# Patient Record
Sex: Male | Born: 1960 | Race: White | Hispanic: No | Marital: Married | Smoking: Current every day smoker
Health system: Southern US, Community
[De-identification: ages and names within clinical notes are randomized; demographics above are authoritative.]

---

## 2018-06-30 ENCOUNTER — Other Ambulatory Visit: Payer: Self-pay

## 2018-06-30 ENCOUNTER — Emergency Department (HOSPITAL_COMMUNITY)
Admission: EM | Admit: 2018-06-30 | Discharge: 2018-06-30 | Disposition: A | Discharge status: Home / Self Care | Payer: Worker's Compensation | Attending: Emergency Medicine | Admitting: Emergency Medicine

## 2018-06-30 ENCOUNTER — Emergency Department (HOSPITAL_COMMUNITY): Payer: Worker's Compensation

## 2018-06-30 ENCOUNTER — Encounter (HOSPITAL_COMMUNITY): Payer: Self-pay | Admitting: Emergency Medicine

## 2018-06-30 DIAGNOSIS — M25512 Pain in left shoulder: Secondary | ICD-10-CM | POA: Diagnosis not present

## 2018-06-30 DIAGNOSIS — W19XXXA Unspecified fall, initial encounter: Secondary | ICD-10-CM

## 2018-06-30 DIAGNOSIS — W1789XA Other fall from one level to another, initial encounter: Secondary | ICD-10-CM | POA: Diagnosis not present

## 2018-06-30 DIAGNOSIS — R0789 Other chest pain: Secondary | ICD-10-CM | POA: Diagnosis not present

## 2018-06-30 DIAGNOSIS — F17200 Nicotine dependence, unspecified, uncomplicated: Secondary | ICD-10-CM | POA: Diagnosis not present

## 2018-06-30 MED ORDER — OXYCODONE-ACETAMINOPHEN 5-325 MG PO TABS
2.0000 | ORAL_TABLET | Freq: Once | ORAL | Status: AC
  Administered 2018-06-30: 2 via ORAL
  Filled 2018-06-30: qty 2

## 2018-06-30 MED ORDER — KETOROLAC TROMETHAMINE 60 MG/2ML IM SOLN
60.0000 mg | Freq: Once | INTRAMUSCULAR | Status: AC
  Administered 2018-06-30: 60 mg via INTRAMUSCULAR
  Filled 2018-06-30: qty 2

## 2018-06-30 MED ORDER — MORPHINE SULFATE 30 MG PO TABS: 30.0000 mg | ORAL_TABLET | Freq: Four times a day (QID) | ORAL | 0 refills | Status: AC | PRN

## 2018-06-30 MED ORDER — HYDROMORPHONE HCL 1 MG/ML IJ SOLN
1.0000 mg | Freq: Once | INTRAMUSCULAR | Status: AC
  Administered 2018-06-30: 1 mg via INTRAMUSCULAR
  Filled 2018-06-30: qty 1

## 2018-06-30 NOTE — ED Notes (Signed)
Bed: WTR7 Expected date:  Expected time:  Means of arrival:  Comments: 

## 2018-06-30 NOTE — Discharge Instructions (Addendum)
Sometimes xrays can miss fractures. If you have persistent pain and not improving by 3-4 days please see your doctor or return here for reevaluation. otherwise use range of motion exercises, anti-inflammatories to help with symptoms. Prescription pain medicine if severe. Do not drive within 8 hours of taking prescription pain medicine.

## 2018-06-30 NOTE — ED Triage Notes (Signed)
Pt fell getting out of truck this morning at 9:40 this am, lost balance, denies LOC, pt c/o L scapular pain radiating to neck.

## 2018-06-30 NOTE — ED Provider Notes (Signed)
Emergency Department Provider Note   I have reviewed the triage vital signs and the nursing notes.   HISTORY  Chief Complaint Fall and Shoulder Pain   HPI Zachary Salas is a 57 y.o. male without significant past medical history the presents the emergency department today for shoulder pain.  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  He has right scapular pain and now started have some soreness and right-sided rib pain as well.  States he hit his head mildly but does not have any headache or neck pain at this time.  No injuries elsewhere.  No pelvic pain, leg pain, shoulder pain.  No rashes.  No syncope.  No other associated or modifying symptoms.    History reviewed. No pertinent past medical history.  There are no active problems to display for this patient.   History reviewed. No pertinent surgical history.    Allergies Asa [aspirin]  History reviewed. No pertinent family history.  Social History Social History   Tobacco Use  . Smoking status: Current Every Day Smoker  Substance Use Topics  . Alcohol use: Not on file  . Drug use: Not on file    Review of Systems  All other systems negative except as documented in the HPI. All pertinent positives and negatives as reviewed in the HPI. ____________________________________________   PHYSICAL EXAM:  VITAL SIGNS: ED Triage Vitals [06/30/18 1038]   Vitals:   06/30/18 1042  BP: (!) 153/84  Pulse: 95  Resp: 16  Temp: 98.1 F (36.7 C)  SpO2: 98%     Constitutional: Alert and oriented. Well appearing and in no acute distress. Eyes: Conjunctivae are normal. PERRL. EOMI. Head: Atraumatic. Nose: No congestion/rhinnorhea. Mouth/Throat: Mucous membranes are moist.  Oropharynx non-erythematous. Neck: No stridor.  No meningeal signs.   Cardiovascular:  Normal rate, regular rhythm. Good peripheral circulation. Grossly normal heart sounds.   Respiratory: Normal respiratory effort.  No retractions. Lungs CTAB. Gastrointestinal: Soft and nontender. No distention.  Musculoskeletal: Tenderness to palpation over his left scapula and pain with range of motion of his left shoulder as well.  Tenderness over his left axillary ribs. Neurologic:  Normal speech and language. No gross focal neurologic deficits are appreciated.  Skin:  Skin is warm, dry and intact. No rash noted.  ____________________________________________   RADIOLOGY  Dg Chest 2 View  Result Date: 06/30/2018 CLINICAL DATA:  Larey Seat from a tractor trailer today, former smoker, history COPD EXAM: CHEST - 2 VIEW COMPARISON:  None FINDINGS: Enlargement of cardiac silhouette. Mediastinal contours and pulmonary vascularity normal. Respiratory motion artifacts on lateral view. Lungs grossly clear. No definite infiltrate, pleural effusion or pneumothorax. Scattered mild endplate spur formation thoracic spine. IMPRESSION: Enlargement of cardiac silhouette. No acute abnormalities. Electronically Signed   By: Ulyses Southward M.D.   On: 06/30/2018 11:43   Dg Shoulder Left  Result Date: 06/30/2018 CLINICAL DATA:  Left shoulder pain after fall today. EXAM: LEFT SHOULDER - 2+ VIEW COMPARISON:  None. FINDINGS: There is no evidence of fracture or dislocation. There is no evidence of arthropathy or other focal bone abnormality. Soft tissues are unremarkable. IMPRESSION: Negative. Electronically Signed   By: Lupita Raider, M.D.   On: 06/30/2018 11:24    ____________________________________________   INITIAL IMPRESSION / ASSESSMENT AND PLAN / ED COURSE  Pain meds.  XR for traumatic injury. dispo pending same.   xr's negative. Pain improved. Will treat for same.   I discussed with the patient the possibility of a missed fracture. I told them that sometimes they are not seen on initial x-ray but if they had  persistent pain for 5 to 7 days they would need to follow-up with their primary doctor or an orthopedic doctor to get repeat evaluation and x-ray to evaluate for missed fracture or ligamentous injury and they stated understanding. Will employ RICE therapy with NSAIDs/tylenol in mean time.   Pertinent labs & imaging results that were available during my care of the patient were reviewed by me and considered in my medical decision making (see chart for details).  ____________________________________________  FINAL CLINICAL IMPRESSION(S) / ED DIAGNOSES  Final diagnoses:  Acute pain of left shoulder  Fall, initial encounter     MEDICATIONS GIVEN DURING THIS VISIT:  Medications  oxyCODONE-acetaminophen (PERCOCET/ROXICET) 5-325 MG per tablet 2 tablet (2 tablets Oral Given 06/30/18 1046)  ketorolac (TORADOL) injection 60 mg (60 mg Intramuscular Given 06/30/18 1307)  HYDROmorphone (DILAUDID) injection 1 mg (1 mg Intramuscular Given 06/30/18 1307)     NEW OUTPATIENT MEDICATIONS STARTED DURING THIS VISIT:  Discharge Medication List as of 06/30/2018 12:53 PM    START taking these medications   Details  morphine (MSIR) 30 MG tablet Take 1 tablet (30 mg total) by mouth every 6 (six) hours as needed for severe pain., Starting Thu 06/30/2018, Print        Note:  This note was prepared with assistance of Dragon voice recognition software. Occasional wrong-word or sound-a-like substitutions may have occurred due to the inherent limitations of voice recognition software.   Marily Memos, MD 06/30/18 [phone]

## 2020-02-29 IMAGING — CR DG CHEST 2V
2 series · 2 of 2 positions shown · non-contrast
Comparison: None

CLINICAL DATA: Fell from a tractor trailer today, former smoker,
history COPD

EXAM:
CHEST - 2 VIEW

[x chest ap]
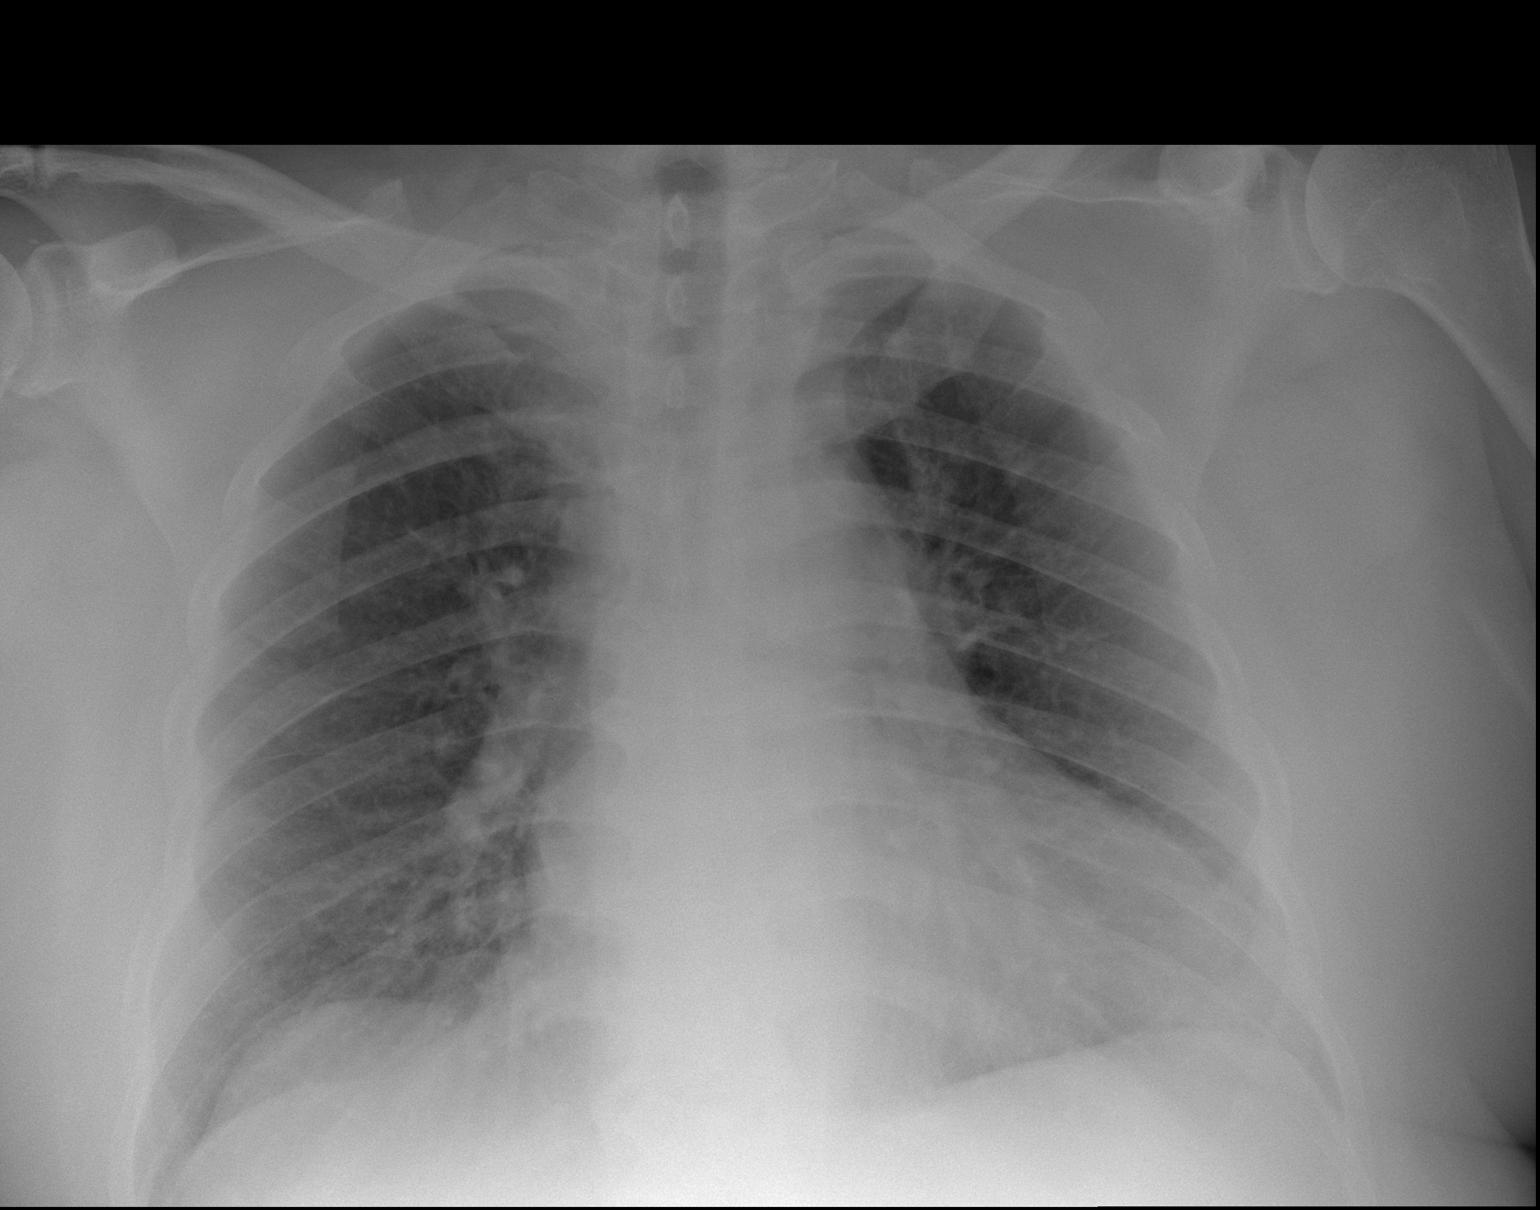

[w chest lat]
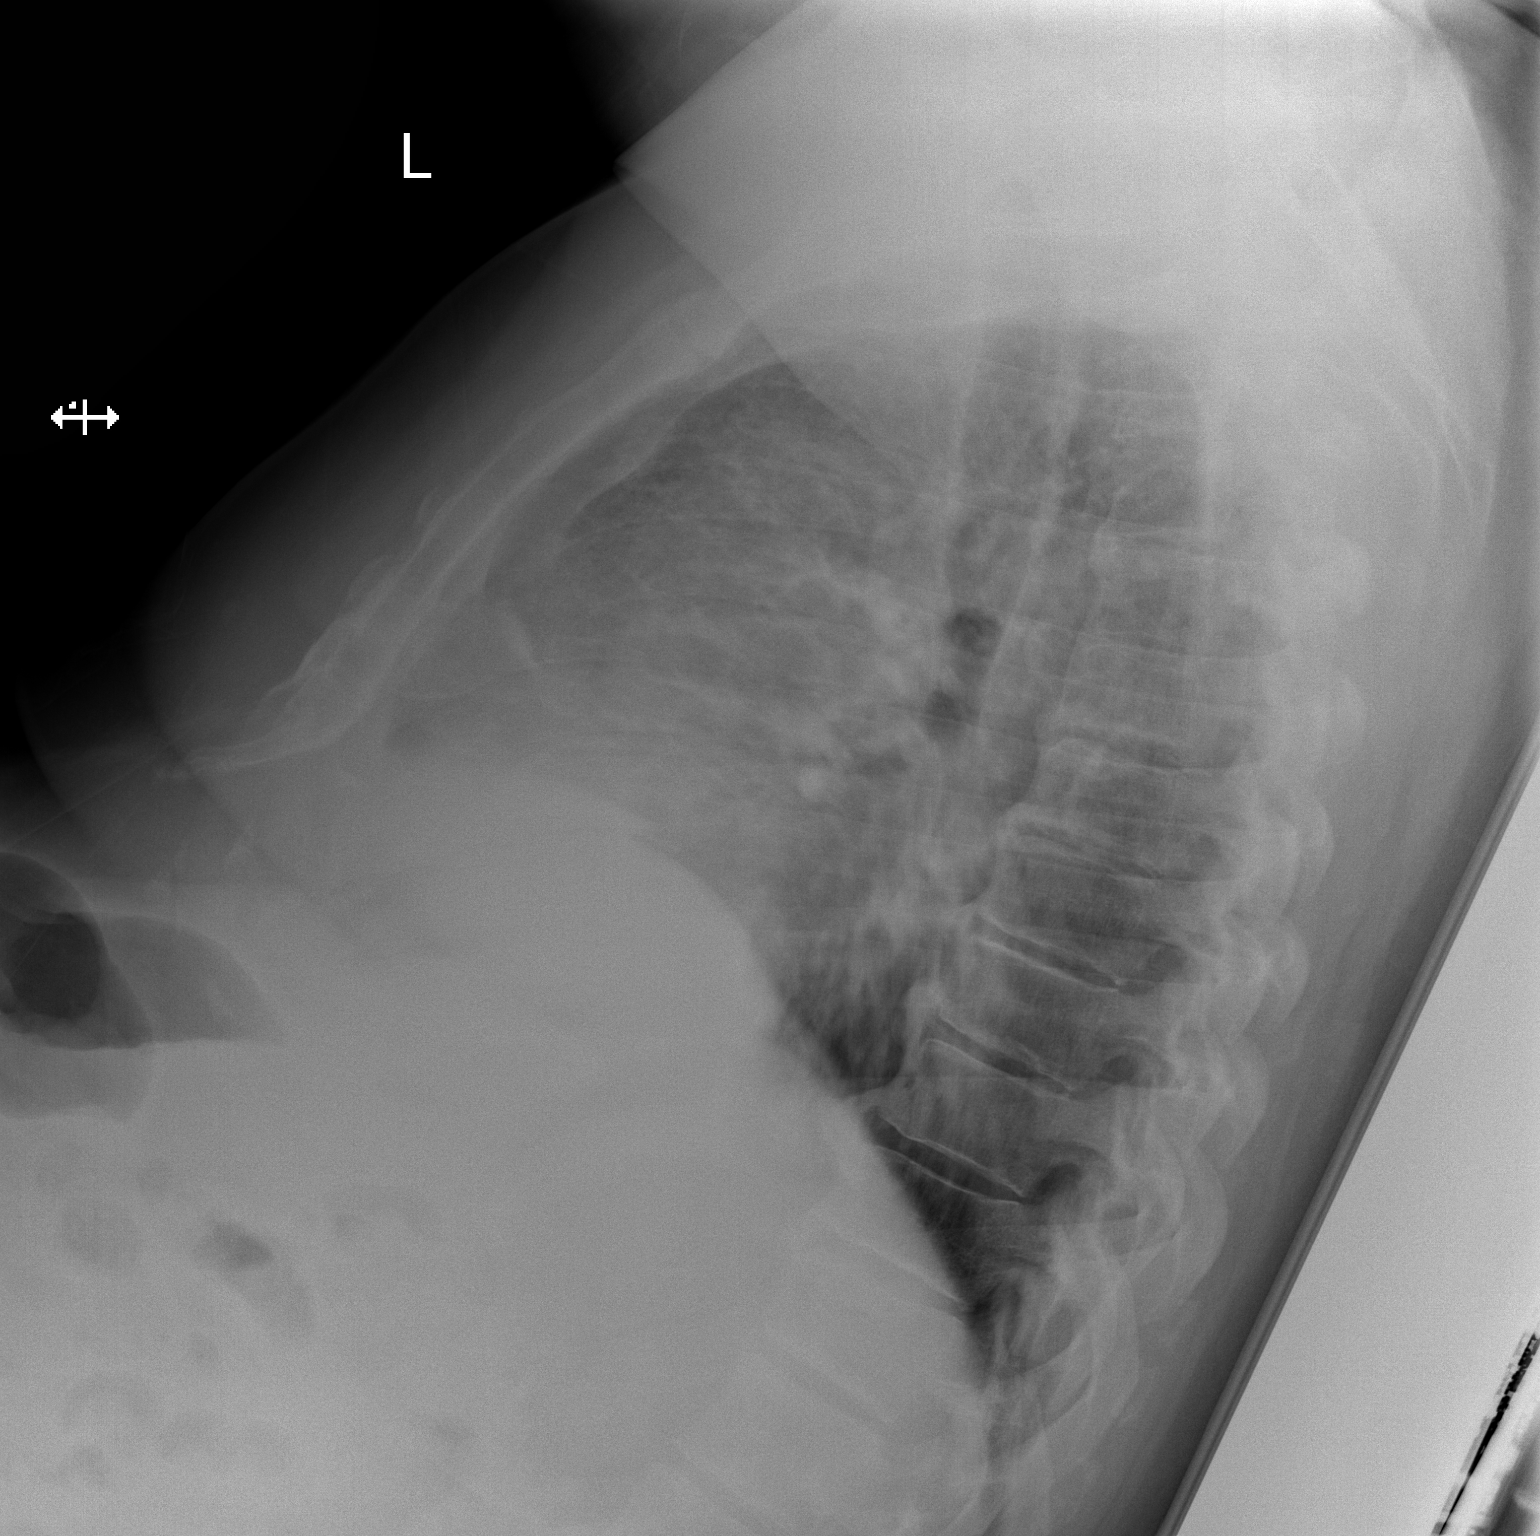

[2 of 2 positions shown; findings below may reference images not displayed]

FINDINGS: Enlargement of cardiac silhouette.

Mediastinal contours and pulmonary vascularity normal.

Respiratory motion artifacts on lateral view.

Lungs grossly clear.

No definite infiltrate, pleural effusion or pneumothorax.

Scattered mild endplate spur formation thoracic spine.
IMPRESSION: Enlargement of cardiac silhouette.

No acute abnormalities.
